# Patient Record
Sex: Female | Born: 1970 | Hispanic: Yes | Marital: Single | Smoking: Never smoker
Health system: Southern US, Community
[De-identification: ages and names within clinical notes are randomized; demographics above are authoritative.]

## PROBLEM LIST (undated history)

## (undated) DIAGNOSIS — R21 Rash and other nonspecific skin eruption: Secondary | ICD-10-CM

## (undated) DIAGNOSIS — N939 Abnormal uterine and vaginal bleeding, unspecified: Secondary | ICD-10-CM

## (undated) DIAGNOSIS — K2970 Gastritis, unspecified, without bleeding: Secondary | ICD-10-CM

## (undated) DIAGNOSIS — N920 Excessive and frequent menstruation with regular cycle: Secondary | ICD-10-CM

## (undated) DIAGNOSIS — N941 Unspecified dyspareunia: Secondary | ICD-10-CM

## (undated) DIAGNOSIS — N921 Excessive and frequent menstruation with irregular cycle: Secondary | ICD-10-CM

---

## 2005-01-02 ENCOUNTER — Observation Stay: Payer: Self-pay | Admitting: Certified Nurse Midwife

## 2005-01-05 ENCOUNTER — Ambulatory Visit: Payer: Self-pay | Admitting: Family Medicine

## 2007-08-28 ENCOUNTER — Ambulatory Visit: Payer: Self-pay

## 2008-12-01 ENCOUNTER — Encounter: Payer: Self-pay | Admitting: Internal Medicine

## 2008-12-05 ENCOUNTER — Encounter: Payer: Self-pay | Admitting: Internal Medicine

## 2009-01-02 ENCOUNTER — Ambulatory Visit: Payer: Self-pay | Admitting: Orthopedic Surgery

## 2009-01-05 ENCOUNTER — Encounter: Payer: Self-pay | Admitting: Internal Medicine

## 2009-01-06 ENCOUNTER — Ambulatory Visit: Payer: Self-pay | Admitting: Orthopedic Surgery

## 2009-03-10 ENCOUNTER — Ambulatory Visit: Payer: Self-pay

## 2010-04-07 ENCOUNTER — Ambulatory Visit: Payer: Self-pay | Admitting: Family Medicine

## 2011-10-12 ENCOUNTER — Ambulatory Visit: Payer: Self-pay

## 2011-12-13 ENCOUNTER — Ambulatory Visit: Payer: Self-pay | Admitting: Gynecologic Oncology

## 2012-01-06 ENCOUNTER — Ambulatory Visit: Payer: Self-pay | Admitting: Gynecologic Oncology

## 2012-12-26 ENCOUNTER — Ambulatory Visit: Payer: Self-pay

## 2015-04-02 ENCOUNTER — Other Ambulatory Visit: Payer: Self-pay | Admitting: Obstetrics and Gynecology

## 2015-04-02 DIAGNOSIS — Z1231 Encounter for screening mammogram for malignant neoplasm of breast: Secondary | ICD-10-CM

## 2015-04-07 ENCOUNTER — Other Ambulatory Visit: Payer: Self-pay | Admitting: Neurology

## 2015-04-07 DIAGNOSIS — G4440 Drug-induced headache, not elsewhere classified, not intractable: Secondary | ICD-10-CM

## 2015-04-11 ENCOUNTER — Ambulatory Visit
Admission: RE | Admit: 2015-04-11 | Discharge: 2015-04-11 | Disposition: A | Discharge status: Home / Self Care | Payer: Managed Care, Other (non HMO) | Source: Ambulatory Visit | Attending: Neurology | Admitting: Neurology

## 2015-04-11 ENCOUNTER — Ambulatory Visit: Payer: Self-pay

## 2015-04-11 DIAGNOSIS — G4440 Drug-induced headache, not elsewhere classified, not intractable: Secondary | ICD-10-CM

## 2015-04-11 DIAGNOSIS — G44221 Chronic tension-type headache, intractable: Secondary | ICD-10-CM | POA: Insufficient documentation

## 2015-04-11 MED ORDER — GADOBENATE DIMEGLUMINE 529 MG/ML IV SOLN
20.0000 mL | Freq: Once | INTRAVENOUS | Status: AC | PRN
  Administered 2015-04-11: 16 mL via INTRAVENOUS

## 2015-04-21 ENCOUNTER — Ambulatory Visit
Admission: RE | Admit: 2015-04-21 | Discharge: 2015-04-21 | Disposition: A | Discharge status: Home / Self Care | Payer: Managed Care, Other (non HMO) | Source: Ambulatory Visit | Attending: Obstetrics and Gynecology | Admitting: Obstetrics and Gynecology

## 2015-04-21 DIAGNOSIS — Z1231 Encounter for screening mammogram for malignant neoplasm of breast: Secondary | ICD-10-CM | POA: Diagnosis present

## 2016-07-23 IMAGING — MR MR HEAD WO/W CM
12 series · 48 of 48 positions shown · IV contrast (multihance)
Comparison: None.

CLINICAL DATA: Rebound headache.  Photophobia.

EXAM:
MRI HEAD WITHOUT AND WITH CONTRAST
TECHNIQUE: Multiplanar, multiecho pulse sequences of the brain and surrounding
structures were obtained without and with intravenous contrast.
CONTRAST:  16mL MULTIHANCE GADOBENATE DIMEGLUMINE 529 MG/ML IV SOLN

[Series 2: T1 · sagittal · 5.0mm · 0.45mm/px · 2 of 27 slices shown (1 of 2)]
[im 1/27]
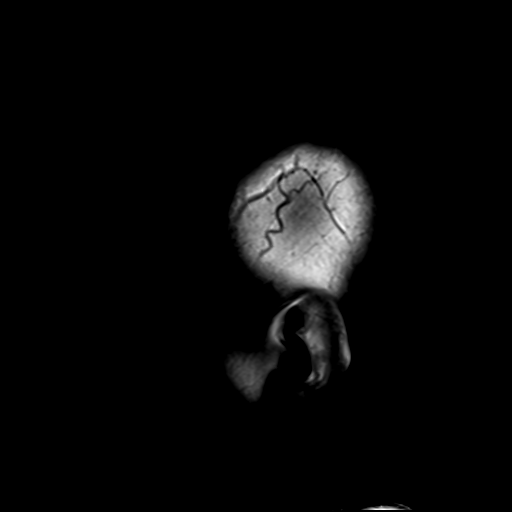
[im 27/27]
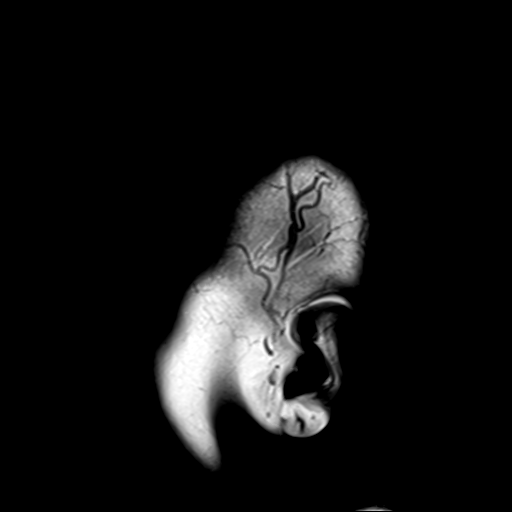

[Series 4: DWI · axial · 3.0mm · 1.80mm/px · z∈[-101,+59]mm · 5 of 55 slices shown (1 of 4)]
[im 1/55]
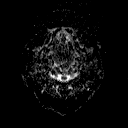
[im 14/55]
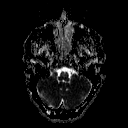
[im 28/55]
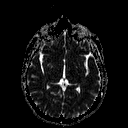
[im 41/55]
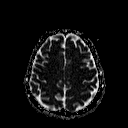
[im 55/55]
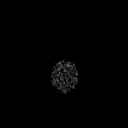

[Series 6: DWI · coronal · 3.0mm · 1.80mm/px · 4 of 45 slices shown (2 of 4)]
[im 1/45]
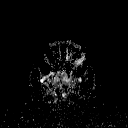
[im 15/45]
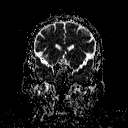
[im 30/45]
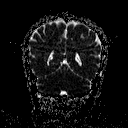
[im 45/45]
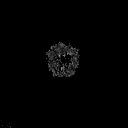

[Series 7: T2 · axial · 5.0mm · 0.60mm/px · z∈[-102,+59]mm · 3 of 26 slices shown (1 of 2)]
[im 1/26]
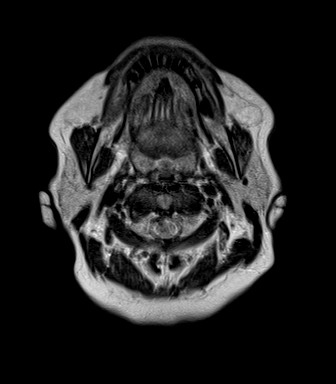
[im 13/26]
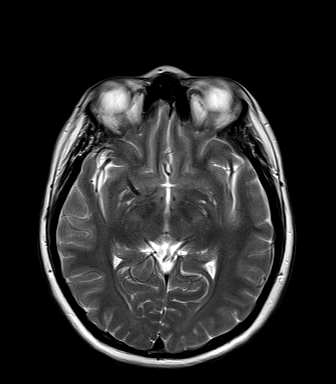
[im 26/26]
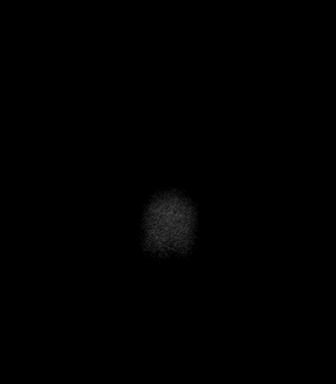

[Series 8: FLAIR · axial · 5.0mm · 0.45mm/px · z∈[-101,+59]mm · 3 of 26 slices shown]
[im 1/26]
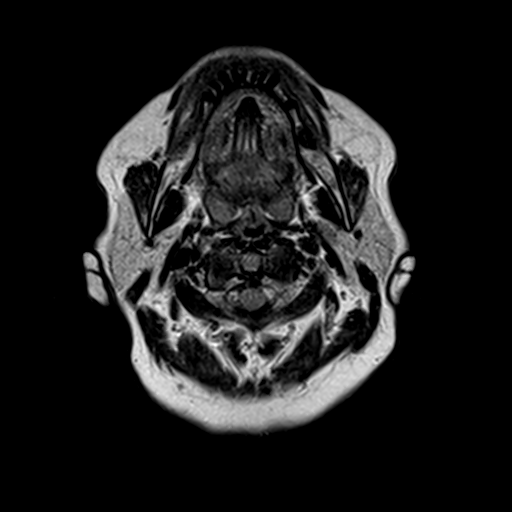
[im 13/26]
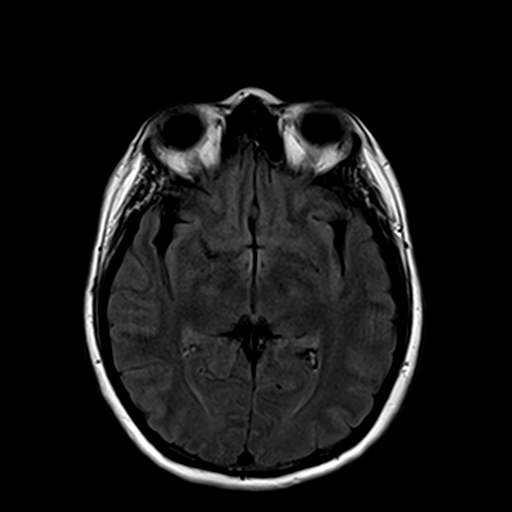
[im 26/26]
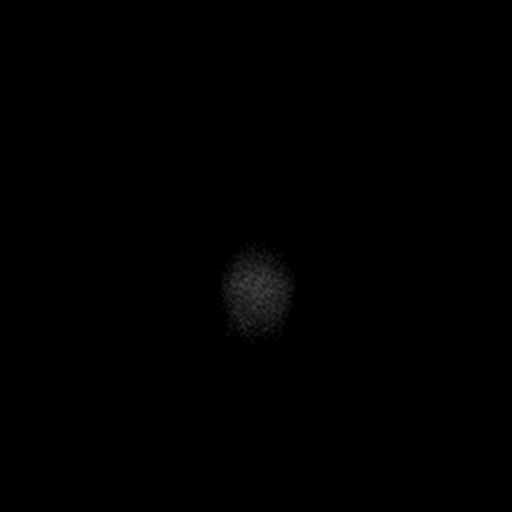

[Series 9: T2 · axial · 5.0mm · 0.45mm/px · z∈[-102,+59]mm · 3 of 26 slices shown (2 of 2)]
[im 1/26]
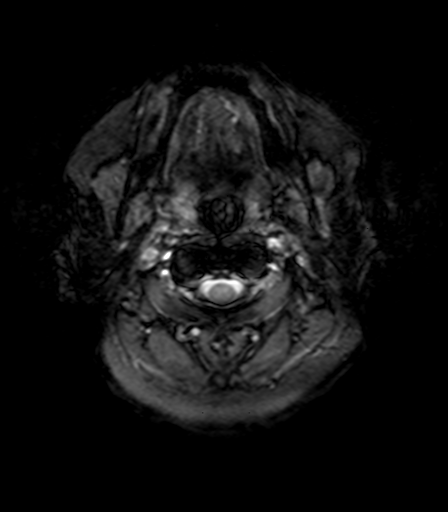
[im 13/26]
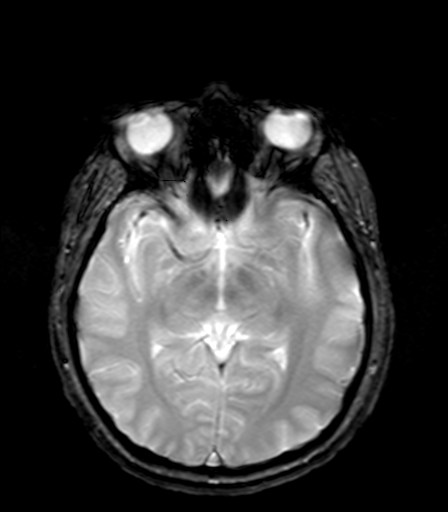
[im 26/26]
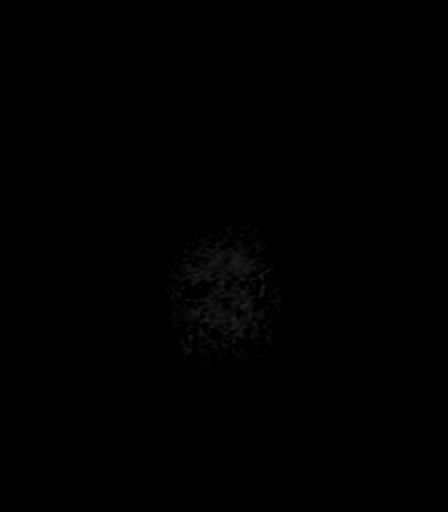

[Series 10: T1 · axial · 3.0mm · 1.00mm/px · z∈[-105,+58]mm · 6 of 56 slices shown (2 of 2)]
[im 1/56]
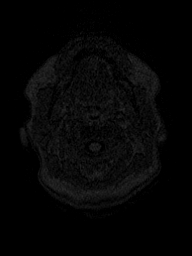
[im 12/56]
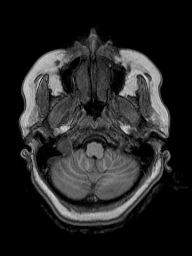
[im 23/56]
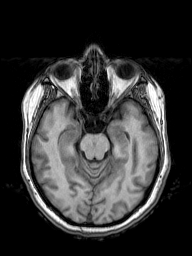
[im 34/56]
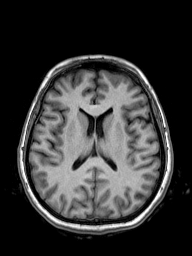
[im 45/56]
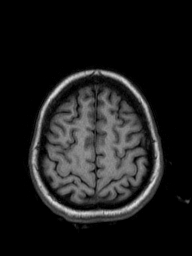
[im 56/56]
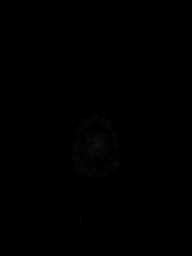

[Series 11: T2 post-contrast · coronal · 5.0mm · 0.49mm/px · 3 of 27 slices shown]
[im 1/27]
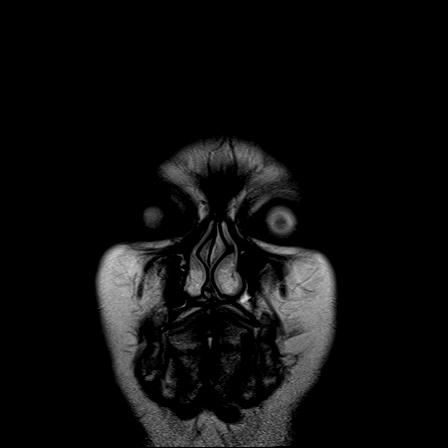
[im 14/27]
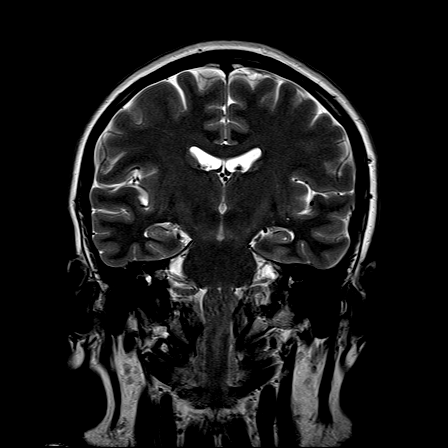
[im 27/27]
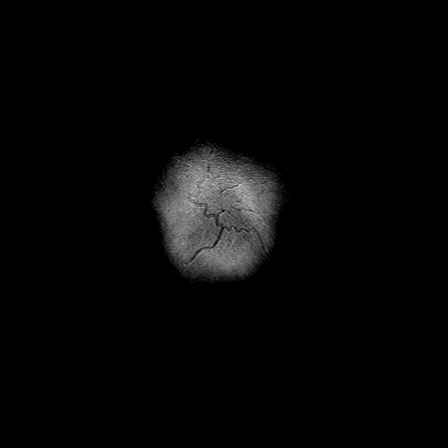

[Series 12: T1 post-contrast · axial · 3.0mm · 1.00mm/px · z∈[-105,+58]mm · 6 of 56 slices shown (1 of 2)]
[im 1/56]
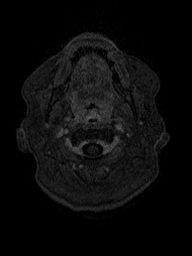
[im 12/56]
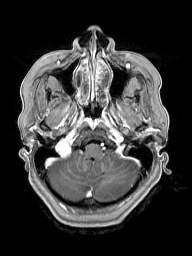
[im 23/56]
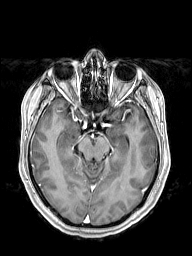
[im 34/56]
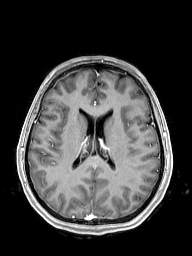
[im 45/56]
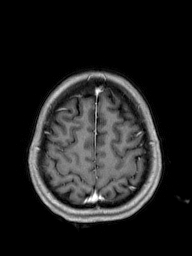
[im 56/56]
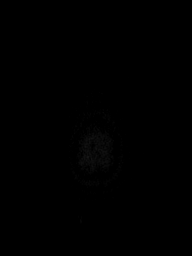

[Series 13: T1 post-contrast · coronal · 5.0mm · 0.43mm/px · 3 of 27 slices shown (2 of 2)]
[im 1/27]
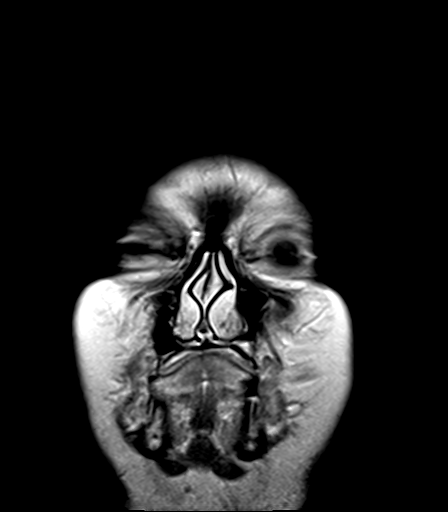
[im 14/27]
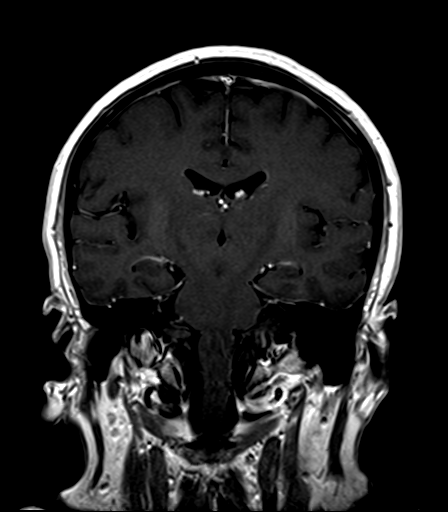
[im 27/27]
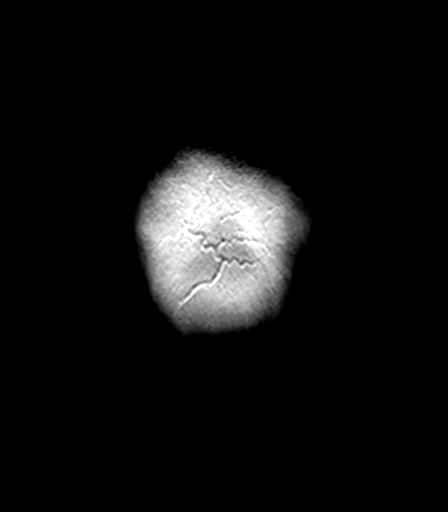

[Series 100: DWI · axial · 3.0mm · 1.80mm/px · z∈[-101,+59]mm · 6 of 55 slices shown (3 of 4)]
[im 1/55]
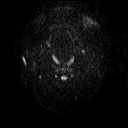
[im 11/55]
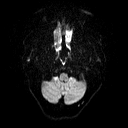
[im 22/55]
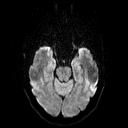
[im 33/55]
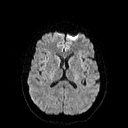
[im 44/55]
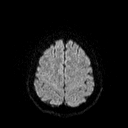
[im 55/55]
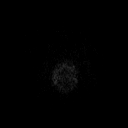

[Series 101: DWI · coronal · 3.0mm · 1.80mm/px · 4 of 44 slices shown (4 of 4)]
[im 1/44]
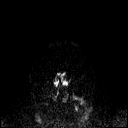
[im 15/44]
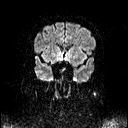
[im 29/44]
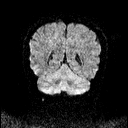
[im 44/44]
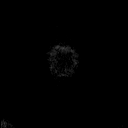

[48 of 48 positions shown; findings below may reference images not displayed]

FINDINGS: Calvarium and upper cervical spine: No marrow signal abnormality. No
abnormal enhancement at the skullbase foramina.

Orbits: No significant findings.

Sinuses: Clear. Mastoid and middle ears are clear.

Brain: No acute or remote infarct, hemorrhage, hydrocephalus, or
mass lesion. No evidence of large vessel occlusion. Prominent
tortuosity of the intracranial vessels, but no white matter disease
or dilated perivascular spaces as often seen with chronic
hypertension.
IMPRESSION: Negative.  No explanation for or sequelae of headaches.

## 2017-01-04 ENCOUNTER — Other Ambulatory Visit: Payer: Self-pay | Admitting: Obstetrics and Gynecology

## 2017-01-04 DIAGNOSIS — Z1231 Encounter for screening mammogram for malignant neoplasm of breast: Secondary | ICD-10-CM

## 2017-02-08 ENCOUNTER — Ambulatory Visit
Admission: RE | Admit: 2017-02-08 | Discharge: 2017-02-08 | Disposition: A | Discharge status: Home / Self Care | Payer: Managed Care, Other (non HMO) | Source: Ambulatory Visit | Attending: Obstetrics and Gynecology | Admitting: Obstetrics and Gynecology

## 2017-02-08 DIAGNOSIS — Z1231 Encounter for screening mammogram for malignant neoplasm of breast: Secondary | ICD-10-CM | POA: Insufficient documentation

## 2017-02-09 ENCOUNTER — Other Ambulatory Visit: Payer: Self-pay | Admitting: Obstetrics and Gynecology

## 2017-02-09 DIAGNOSIS — N6489 Other specified disorders of breast: Secondary | ICD-10-CM

## 2017-02-09 DIAGNOSIS — R928 Other abnormal and inconclusive findings on diagnostic imaging of breast: Secondary | ICD-10-CM

## 2017-03-14 ENCOUNTER — Ambulatory Visit
Admission: RE | Admit: 2017-03-14 | Discharge: 2017-03-14 | Disposition: A | Discharge status: Home / Self Care | Payer: Managed Care, Other (non HMO) | Source: Ambulatory Visit | Attending: Obstetrics and Gynecology | Admitting: Obstetrics and Gynecology

## 2017-03-14 DIAGNOSIS — R928 Other abnormal and inconclusive findings on diagnostic imaging of breast: Secondary | ICD-10-CM

## 2017-03-14 DIAGNOSIS — N6001 Solitary cyst of right breast: Secondary | ICD-10-CM | POA: Insufficient documentation

## 2017-03-14 DIAGNOSIS — N6489 Other specified disorders of breast: Secondary | ICD-10-CM | POA: Diagnosis present

## 2017-08-28 ENCOUNTER — Other Ambulatory Visit: Payer: Self-pay | Admitting: Otolaryngology

## 2017-08-28 DIAGNOSIS — E041 Nontoxic single thyroid nodule: Secondary | ICD-10-CM

## 2017-09-04 ENCOUNTER — Ambulatory Visit
Admission: RE | Admit: 2017-09-04 | Discharge: 2017-09-04 | Disposition: A | Discharge status: Home / Self Care | Payer: Managed Care, Other (non HMO) | Source: Ambulatory Visit | Attending: Otolaryngology | Admitting: Otolaryngology

## 2017-09-04 DIAGNOSIS — E041 Nontoxic single thyroid nodule: Secondary | ICD-10-CM | POA: Diagnosis present

## 2019-05-13 ENCOUNTER — Other Ambulatory Visit: Payer: Self-pay | Admitting: Obstetrics and Gynecology

## 2019-05-13 DIAGNOSIS — Z1231 Encounter for screening mammogram for malignant neoplasm of breast: Secondary | ICD-10-CM

## 2019-06-18 ENCOUNTER — Other Ambulatory Visit: Payer: Self-pay

## 2019-06-18 ENCOUNTER — Ambulatory Visit
Admission: RE | Admit: 2019-06-18 | Discharge: 2019-06-18 | Disposition: A | Discharge status: Home / Self Care | Payer: PRIVATE HEALTH INSURANCE | Source: Ambulatory Visit | Attending: Obstetrics and Gynecology | Admitting: Obstetrics and Gynecology

## 2019-06-18 DIAGNOSIS — Z1231 Encounter for screening mammogram for malignant neoplasm of breast: Secondary | ICD-10-CM | POA: Diagnosis not present

## 2019-09-03 ENCOUNTER — Other Ambulatory Visit: Payer: Self-pay | Admitting: Student

## 2019-09-03 DIAGNOSIS — R1010 Upper abdominal pain, unspecified: Secondary | ICD-10-CM

## 2019-12-02 ENCOUNTER — Other Ambulatory Visit: Admission: RE | Admit: 2019-12-02 | Payer: Managed Care, Other (non HMO) | Source: Ambulatory Visit

## 2019-12-04 ENCOUNTER — Encounter: Admission: RE | Payer: Self-pay | Source: Home / Self Care

## 2019-12-04 ENCOUNTER — Ambulatory Visit
Admission: RE | Admit: 2019-12-04 | Payer: PRIVATE HEALTH INSURANCE | Source: Home / Self Care | Admitting: Internal Medicine

## 2019-12-04 SURGERY — ESOPHAGOGASTRODUODENOSCOPY (EGD) WITH PROPOFOL
Anesthesia: General

## 2020-03-31 ENCOUNTER — Other Ambulatory Visit
Admission: RE | Admit: 2020-03-31 | Discharge: 2020-03-31 | Disposition: A | Discharge status: Home / Self Care | Payer: PRIVATE HEALTH INSURANCE | Source: Ambulatory Visit | Attending: Internal Medicine | Admitting: Internal Medicine

## 2020-03-31 ENCOUNTER — Other Ambulatory Visit: Payer: Self-pay

## 2020-03-31 DIAGNOSIS — Z01812 Encounter for preprocedural laboratory examination: Secondary | ICD-10-CM | POA: Insufficient documentation

## 2020-03-31 DIAGNOSIS — Z20822 Contact with and (suspected) exposure to covid-19: Secondary | ICD-10-CM | POA: Diagnosis not present

## 2020-03-31 LAB — SARS CORONAVIRUS 2 (TAT 6-24 HRS): SARS Coronavirus 2: NEGATIVE

## 2020-04-01 ENCOUNTER — Encounter: Payer: Self-pay | Admitting: Internal Medicine

## 2020-04-02 ENCOUNTER — Ambulatory Visit: Payer: No Typology Code available for payment source | Admitting: Registered Nurse

## 2020-04-02 ENCOUNTER — Encounter
Admission: RE | Disposition: A | Discharge status: Home / Self Care | Payer: Self-pay | Source: Home / Self Care | Attending: Internal Medicine

## 2020-04-02 ENCOUNTER — Encounter: Payer: Self-pay | Admitting: Internal Medicine

## 2020-04-02 ENCOUNTER — Ambulatory Visit
Admission: RE | Admit: 2020-04-02 | Discharge: 2020-04-02 | Disposition: A | Discharge status: Home / Self Care | Payer: No Typology Code available for payment source | Attending: Internal Medicine | Admitting: Internal Medicine

## 2020-04-02 DIAGNOSIS — K6389 Other specified diseases of intestine: Secondary | ICD-10-CM | POA: Diagnosis not present

## 2020-04-02 DIAGNOSIS — R1013 Epigastric pain: Secondary | ICD-10-CM | POA: Diagnosis not present

## 2020-04-02 DIAGNOSIS — K640 First degree hemorrhoids: Secondary | ICD-10-CM | POA: Diagnosis not present

## 2020-04-02 DIAGNOSIS — Z86010 Personal history of colonic polyps: Secondary | ICD-10-CM | POA: Diagnosis not present

## 2020-04-02 DIAGNOSIS — K2100 Gastro-esophageal reflux disease with esophagitis, without bleeding: Secondary | ICD-10-CM | POA: Diagnosis not present

## 2020-04-02 DIAGNOSIS — K2950 Unspecified chronic gastritis without bleeding: Secondary | ICD-10-CM | POA: Diagnosis not present

## 2020-04-02 DIAGNOSIS — Z1211 Encounter for screening for malignant neoplasm of colon: Secondary | ICD-10-CM | POA: Insufficient documentation

## 2020-04-02 HISTORY — DX: Rash and other nonspecific skin eruption: R21

## 2020-04-02 HISTORY — DX: Abnormal uterine and vaginal bleeding, unspecified: N93.9

## 2020-04-02 HISTORY — DX: Gastritis, unspecified, without bleeding: K29.70

## 2020-04-02 HISTORY — PX: COLONOSCOPY WITH PROPOFOL: SHX5780

## 2020-04-02 HISTORY — PX: ESOPHAGOGASTRODUODENOSCOPY (EGD) WITH PROPOFOL: SHX5813

## 2020-04-02 HISTORY — DX: Excessive and frequent menstruation with irregular cycle: N92.1

## 2020-04-02 HISTORY — DX: Excessive and frequent menstruation with regular cycle: N92.0

## 2020-04-02 HISTORY — DX: Unspecified dyspareunia: N94.10

## 2020-04-02 SURGERY — COLONOSCOPY WITH PROPOFOL
Anesthesia: General

## 2020-04-02 MED ORDER — PROPOFOL 500 MG/50ML IV EMUL
INTRAVENOUS | Status: DC | PRN
  Administered 2020-04-02: 140 ug/kg/min via INTRAVENOUS

## 2020-04-02 MED ORDER — PROPOFOL 10 MG/ML IV BOLUS
INTRAVENOUS | Status: DC | PRN
  Administered 2020-04-02: 70 mg via INTRAVENOUS
  Administered 2020-04-02: 30 mg via INTRAVENOUS

## 2020-04-02 MED ORDER — SODIUM CHLORIDE 0.9 % IV SOLN: INTRAVENOUS | Status: DC

## 2020-04-02 MED ORDER — LIDOCAINE HCL (CARDIAC) PF 100 MG/5ML IV SOSY
PREFILLED_SYRINGE | INTRAVENOUS | Status: DC | PRN
  Administered 2020-04-02: 60 mg via INTRAVENOUS

## 2020-04-02 NOTE — Op Note (Signed)
[facility] Gastroenterology Patient Name: Leslie Bailey Procedure Date: 04/02/2020 9:50 AM MRN: [id number] Account #: [id number] Date of Birth: [birth year] Admit Type: Outpatient Age: 49 Room: [facility] ENDO ROOM 4 Gender: Female Note Status: Finalized Procedure:             Upper GI endoscopy Indications:           Epigastric abdominal pain, Esophageal reflux,                         Follow-up of Helicobacter pylori Providers:             Benay Pike. Alice Reichert MD, MD Referring MD:          Tracie Harrier, MD (Referring MD) Medicines:             Propofol per Anesthesia Complications:         No immediate complications. Procedure:             Pre-Anesthesia Assessment:                        - The risks and benefits of the procedure and the                         sedation options and risks were discussed with the                         patient. All questions were answered and informed                         consent was obtained.                        - Patient identification and proposed procedure were                         verified prior to the procedure by the nurse. The                         procedure was verified in the procedure room.                        - ASA Grade Assessment: III - A patient with severe                         systemic disease.                        - After reviewing the risks and benefits, the patient                         was deemed in satisfactory condition to undergo the                         procedure.                        After obtaining informed consent, the endoscope was                         passed under direct vision. Throughout  the procedure,                         the patient's blood pressure, pulse, and oxygen                         saturations were monitored continuously. The Endoscope                         was introduced through the mouth, and advanced to the                         third part  of duodenum. The upper GI endoscopy was                         accomplished without difficulty. The patient tolerated                         the procedure well. The upper GI endoscopy was                         accomplished without difficulty. The patient tolerated                         the procedure well. Findings:      The Z-line was irregular and was found at the gastroesophageal junction.       Mucosa was biopsied with a cold forceps for histology. One specimen       bottle was sent to pathology.      Localized minimal inflammation characterized by erythema was found in       the gastric antrum. Biopsies were taken with a cold forceps for       Helicobacter pylori testing.      The cardia and gastric fundus were normal on retroflexion.      The examined duodenum was normal.      The exam was otherwise without abnormality. Impression:            - Z-line irregular, at the gastroesophageal junction.                         Biopsied.                        - Gastritis. Biopsied.                        - Normal examined duodenum.                        - The examination was otherwise normal. Recommendation:        - Await pathology results.                        - Proceed with colonoscopy Procedure Code(s):     --- Professional ---                        [phone], Esophagogastroduodenoscopy, flexible,                         transoral; with biopsy, single or multiple Diagnosis  Code(s):     --- Professional ---                        B96.81, Helicobacter pylori [H. pylori] as the cause                         of diseases classified elsewhere                        K21.9, Gastro-esophageal reflux disease without                         esophagitis                        R10.13, Epigastric pain                        K29.70, Gastritis, unspecified, without bleeding                        K22.8, Other specified diseases of esophagus CPT copyright 2019 American Medical Association. All  rights reserved. The codes documented in this report are preliminary and upon coder review may  be revised to meet current compliance requirements. Attending Participation:      I personally performed the entire procedure. Stanton Kidney MD, MD 04/02/2020 10:19:22 AM This report has been signed electronically. Number of Addenda: 0 Note Initiated On: 04/02/2020 9:50 AM Estimated Blood Loss:  Estimated blood loss: none.      [facility]

## 2020-04-02 NOTE — Anesthesia Preprocedure Evaluation (Addendum)
Anesthesia Evaluation  Patient identified by MRN, date of birth, ID band Patient awake    Reviewed: Allergy & Precautions, H&P , NPO status , reviewed documented beta blocker date and time   Airway Mallampati: II  TM Distance: >3 FB Neck ROM: full    Dental  (+) Upper Dentures, Partial Lower   Pulmonary    Pulmonary exam normal        Cardiovascular Normal cardiovascular exam     Neuro/Psych    GI/Hepatic   Endo/Other    Renal/GU      Musculoskeletal   Abdominal   Peds  Hematology   Anesthesia Other Findings Past Medical History: No date: Abnormal uterine bleeding (AUB) No date: Dyspareunia in female No date: Gastritis No date: Macular rash No date: Menorrhagia with irregular cycle No date: Menorrhagia with regular cycle  History reviewed.  Surgical history: Finger.  BMI    Body Mass Index: 28.96 kg/m      Reproductive/Obstetrics                             Anesthesia Physical Anesthesia Plan  ASA: II  Anesthesia Plan: General   Post-op Pain Management:    Induction: Intravenous  PONV Risk Score and Plan: Treatment may vary due to age or medical condition and TIVA  Airway Management Planned: Nasal Cannula and Natural Airway  Additional Equipment:   Intra-op Plan:   Post-operative Plan:   Informed Consent: I have reviewed the patients History and Physical, chart, labs and discussed the procedure including the risks, benefits and alternatives for the proposed anesthesia with the patient or authorized representative who has indicated his/her understanding and acceptance.     Dental Advisory Given  Plan Discussed with: CRNA  Anesthesia Plan Comments:        Anesthesia Quick Evaluation

## 2020-04-02 NOTE — H&P (Signed)
Outpatient short stay form Pre-procedure 04/02/2020 9:03 AM Leslie Bailey Leslie Bailey, M.D.  Primary Physician: Leslie Bailey, M.D.  Reason for visit:  Personal hx of adenomatous colon polyps, H Pylori infection/gastritis  History of present illness:                            Patient presents for colonoscopy for a personal hx of colon polyps. The patient denies , abnormal weight loss or rectal bleeding. Patient has chronic constipation. Patient also presents for follow up of H Pylori Gastritis ,GERD, early satiety, abdominal pain. Symptoms are intermittent and chronic and besides GERD, not well controlled with PPI.    No current facility-administered medications for this encounter.  No medications prior to admission.     No Known Allergies   Past Medical History:  Diagnosis Date  . Abnormal uterine bleeding (AUB)   . Dyspareunia in female   . Gastritis   . Macular rash   . Menorrhagia with irregular cycle   . Menorrhagia with regular cycle     Review of systems:  Otherwise negative.    Physical Exam  Gen: Alert, oriented. Appears stated age.  HEENT: [place]/AT. PERRLA. Lungs: CTA, no wheezes. CV: RR nl S1, S2. Abd: soft, benign, no masses. BS+ Ext: No edema. Pulses 2+    Planned procedures: Proceed with EGD and colonoscopy. The patient understands the nature of the planned procedure, indications, risks, alternatives and potential complications including but not limited to bleeding, infection, perforation, damage to internal organs and possible oversedation/side effects from anesthesia. The patient agrees and gives consent to proceed.  Please refer to procedure notes for findings, recommendations and patient disposition/instructions.     Leslie Bailey Leslie Bailey, M.D. Gastroenterology 04/02/2020  9:03 AM

## 2020-04-02 NOTE — Op Note (Addendum)
[facility] Gastroenterology Patient Name: Leslie Bailey Procedure Date: 04/02/2020 9:42 AM MRN: [id number] Account #: [id number] Date of Birth: 09/23/1971 Admit Type: Outpatient Age: 49 Room: [facility] ENDO ROOM 4 Gender: Female Note Status: Supervisor Override Procedure:             Colonoscopy Indications:           Screening for colorectal malignant neoplasm Providers:             Boykin Nearing. [name] MD, MD Referring MD:          [name], MD (Referring MD) Medicines:             Propofol per Anesthesia Complications:         No immediate complications. Procedure:             Pre-Anesthesia Assessment:                        - The risks and benefits of the procedure and the                         sedation options and risks were discussed with the                         patient. All questions were answered and informed                         consent was obtained.                        - Patient identification and proposed procedure were                         verified prior to the procedure by the nurse. The                         procedure was verified in the procedure room.                        After obtaining informed consent, the colonoscope was                         passed under direct vision. Throughout the procedure,                         the patient's blood pressure, pulse, and oxygen                         saturations were monitored continuously. The                         Colonoscope was introduced through the anus and                         advanced to the the cecum, identified by appendiceal                         orifice and ileocecal valve. The colonoscopy was  performed without difficulty. The patient tolerated                         the procedure well. The quality of the bowel                         preparation was good. The ileocecal valve, appendiceal                         orifice, and  rectum were photographed. The ileocecal                         valve, appendiceal orifice, and rectum were                         photographed. Findings:      The perianal and digital rectal examinations were normal. Pertinent       negatives include normal sphincter tone and no palpable rectal lesions.      A diffuse area of moderately melanotic mucosa was found in the rectum.       Biopsies were taken with a cold forceps for histology.      A diffuse area of moderate melanosis was found in the entire colon.      Non-bleeding internal hemorrhoids were found during retroflexion. The       hemorrhoids were Grade I (internal hemorrhoids that do not prolapse).      The exam was otherwise without abnormality. Impression:            - Melanotic mucosa in the rectum. Biopsied.                        - Melanosis in the colon.                        - Non-bleeding internal hemorrhoids.                        - The examination was otherwise normal. Recommendation:        - Patient has a contact number available for                         emergencies. The signs and symptoms of potential                         delayed complications were discussed with the patient.                         Return to normal activities tomorrow. Written                         discharge instructions were provided to the patient.                        - Await pathology results from EGD, also performed                         today.                        - Resume previous  diet.                        - Continue present medications.                        - Await pathology results.                        - Repeat colonoscopy in 10 years for screening                         purposes.                        - Return to GI office in 3 months.                        - Follow up with Vevelyn Pat, GI Nurse                         Practioner, in office to discuss results and monitor                          progress. Procedure Code(s):     --- Professional ---                        [phone], Colonoscopy, flexible; with biopsy, single or                         multiple Diagnosis Code(s):     --- Professional ---                        R19.4, Change in bowel habit                        K64.0, First degree hemorrhoids                        K63.89, Other specified diseases of intestine                        K62.89, Other specified diseases of anus and rectum CPT copyright 2019 American Medical Association. All rights reserved. The codes documented in this report are preliminary and upon coder review may  be revised to meet current compliance requirements. Stanton Kidney MD, MD 04/02/2020 10:35:24 AM This report has been signed electronically. Number of Addenda: 0 Note Initiated On: 04/02/2020 9:42 AM Scope Withdrawal Time: 0 hours 4 minutes 32 seconds  Total Procedure Duration: 0 hours 8 minutes 52 seconds  Estimated Blood Loss:  Estimated blood loss: none.      [facility]

## 2020-04-02 NOTE — Anesthesia Postprocedure Evaluation (Signed)
Anesthesia Post Note  Patient: Leslie Bailey  Procedure(s) Performed: COLONOSCOPY WITH PROPOFOL (N/A ) ESOPHAGOGASTRODUODENOSCOPY (EGD) WITH PROPOFOL (N/A )  Patient location during evaluation: Endoscopy Anesthesia Type: General Level of consciousness: awake and alert Pain management: pain level controlled Vital Signs Assessment: post-procedure vital signs reviewed and stable Respiratory status: spontaneous breathing, nonlabored ventilation and respiratory function stable Cardiovascular status: blood pressure returned to baseline and stable Postop Assessment: no apparent nausea or vomiting Anesthetic complications: no     Last Vitals:  Vitals:   04/02/20 1053 04/02/20 1103  BP:  (!) 144/91  Pulse: 84 74  Resp: (!) 22 17  Temp:    SpO2: 98% 98%    Last Pain:  Vitals:   04/02/20 1103  TempSrc:   PainSc: 0-No pain                 Christia Reading

## 2020-04-02 NOTE — Transfer of Care (Signed)
Immediate Anesthesia Transfer of Care Note  Patient: Leslie Bailey  Procedure(s) Performed: COLONOSCOPY WITH PROPOFOL (N/A ) ESOPHAGOGASTRODUODENOSCOPY (EGD) WITH PROPOFOL (N/A )  Patient Location: PACU  Anesthesia Type:General  Level of Consciousness: sedated  Airway & Oxygen Therapy: Patient Spontanous Breathing  Post-op Assessment: Report given to RN and Post -op Vital signs reviewed and stable  Post vital signs: Reviewed and stable  Last Vitals:  Vitals Value Taken Time  BP    Temp    Pulse    Resp    SpO2      Last Pain:  Vitals:   04/02/20 0935  TempSrc: Temporal  PainSc: 0-No pain         Complications: No apparent anesthesia complications

## 2020-04-03 LAB — SURGICAL PATHOLOGY

## 2020-10-28 ENCOUNTER — Other Ambulatory Visit: Payer: Self-pay | Admitting: Internal Medicine

## 2020-10-28 DIAGNOSIS — Z1231 Encounter for screening mammogram for malignant neoplasm of breast: Secondary | ICD-10-CM

## 2021-10-05 ENCOUNTER — Other Ambulatory Visit: Payer: Self-pay | Admitting: Internal Medicine

## 2021-11-05 ENCOUNTER — Other Ambulatory Visit: Payer: Self-pay | Admitting: Family Medicine

## 2021-11-05 DIAGNOSIS — Z1231 Encounter for screening mammogram for malignant neoplasm of breast: Secondary | ICD-10-CM

## 2022-09-26 ENCOUNTER — Other Ambulatory Visit: Payer: Self-pay | Admitting: Internal Medicine

## 2022-09-26 DIAGNOSIS — Z1231 Encounter for screening mammogram for malignant neoplasm of breast: Secondary | ICD-10-CM

## 2022-11-02 ENCOUNTER — Ambulatory Visit
Admission: RE | Admit: 2022-11-02 | Discharge: 2022-11-02 | Disposition: A | Discharge status: Home / Self Care | Payer: No Typology Code available for payment source | Source: Ambulatory Visit | Attending: Internal Medicine | Admitting: Internal Medicine

## 2022-11-02 DIAGNOSIS — Z1231 Encounter for screening mammogram for malignant neoplasm of breast: Secondary | ICD-10-CM | POA: Insufficient documentation

## 2022-11-08 ENCOUNTER — Other Ambulatory Visit: Payer: Self-pay | Admitting: Internal Medicine

## 2022-11-08 DIAGNOSIS — N63 Unspecified lump in unspecified breast: Secondary | ICD-10-CM

## 2022-11-08 DIAGNOSIS — R928 Other abnormal and inconclusive findings on diagnostic imaging of breast: Secondary | ICD-10-CM

## 2022-11-23 ENCOUNTER — Ambulatory Visit
Admission: RE | Admit: 2022-11-23 | Discharge: 2022-11-23 | Disposition: A | Discharge status: Home / Self Care | Payer: No Typology Code available for payment source | Source: Ambulatory Visit | Attending: Internal Medicine | Admitting: Internal Medicine

## 2022-11-23 DIAGNOSIS — R928 Other abnormal and inconclusive findings on diagnostic imaging of breast: Secondary | ICD-10-CM

## 2022-11-23 DIAGNOSIS — N63 Unspecified lump in unspecified breast: Secondary | ICD-10-CM | POA: Diagnosis present

## 2023-12-14 ENCOUNTER — Other Ambulatory Visit: Payer: Self-pay | Admitting: Obstetrics and Gynecology

## 2023-12-14 DIAGNOSIS — Z1231 Encounter for screening mammogram for malignant neoplasm of breast: Secondary | ICD-10-CM

## 2024-01-17 ENCOUNTER — Ambulatory Visit
Admission: RE | Admit: 2024-01-17 | Discharge: 2024-01-17 | Disposition: A | Discharge status: Home / Self Care | Payer: BC Managed Care – PPO | Source: Ambulatory Visit | Attending: Obstetrics and Gynecology | Admitting: Obstetrics and Gynecology

## 2024-01-17 DIAGNOSIS — Z1231 Encounter for screening mammogram for malignant neoplasm of breast: Secondary | ICD-10-CM | POA: Diagnosis present
